# Patient Record
Sex: Female | Born: 1980 | Hispanic: Yes
Health system: Southern US, Community
[De-identification: ages and names within clinical notes are randomized; demographics above are authoritative.]

## PROBLEM LIST (undated history)

## (undated) DIAGNOSIS — E785 Hyperlipidemia, unspecified: Secondary | ICD-10-CM

## (undated) DIAGNOSIS — F319 Bipolar disorder, unspecified: Secondary | ICD-10-CM

## (undated) DIAGNOSIS — E119 Type 2 diabetes mellitus without complications: Secondary | ICD-10-CM

---

## 2020-02-15 ENCOUNTER — Encounter (HOSPITAL_COMMUNITY): Payer: Self-pay

## 2020-02-15 ENCOUNTER — Emergency Department (HOSPITAL_COMMUNITY): Payer: Self-pay

## 2020-02-15 ENCOUNTER — Emergency Department (HOSPITAL_COMMUNITY)
Admission: EM | Admit: 2020-02-15 | Discharge: 2020-02-16 | Disposition: A | Discharge status: Home / Self Care | Payer: Self-pay | Attending: Emergency Medicine | Admitting: Emergency Medicine

## 2020-02-15 ENCOUNTER — Other Ambulatory Visit: Payer: Self-pay

## 2020-02-15 DIAGNOSIS — U071 COVID-19: Secondary | ICD-10-CM

## 2020-02-15 DIAGNOSIS — E119 Type 2 diabetes mellitus without complications: Secondary | ICD-10-CM | POA: Insufficient documentation

## 2020-02-15 DIAGNOSIS — Z794 Long term (current) use of insulin: Secondary | ICD-10-CM | POA: Insufficient documentation

## 2020-02-15 DIAGNOSIS — J1282 Pneumonia due to coronavirus disease 2019: Secondary | ICD-10-CM

## 2020-02-15 DIAGNOSIS — R0602 Shortness of breath: Secondary | ICD-10-CM | POA: Insufficient documentation

## 2020-02-15 DIAGNOSIS — R0789 Other chest pain: Secondary | ICD-10-CM | POA: Insufficient documentation

## 2020-02-15 HISTORY — DX: Type 2 diabetes mellitus without complications: E11.9

## 2020-02-15 HISTORY — DX: Hyperlipidemia, unspecified: E78.5

## 2020-02-15 HISTORY — DX: Bipolar disorder, unspecified: F31.9

## 2020-02-15 LAB — CBC
HCT: 39.9 % (ref 36.0–46.0)
Hemoglobin: 13.5 g/dL (ref 12.0–15.0)
MCH: 30.6 pg (ref 26.0–34.0)
MCHC: 33.8 g/dL (ref 30.0–36.0)
MCV: 90.5 fL (ref 80.0–100.0)
Platelets: 404 10*3/uL — ABNORMAL HIGH (ref 150–400)
RBC: 4.41 MIL/uL (ref 3.87–5.11)
RDW: 12.8 % (ref 11.5–15.5)
WBC: 11.6 10*3/uL — ABNORMAL HIGH (ref 4.0–10.5)
nRBC: 0 % (ref 0.0–0.2)

## 2020-02-15 LAB — BASIC METABOLIC PANEL
Anion gap: 11 (ref 5–15)
BUN: 18 mg/dL (ref 6–20)
CO2: 23 mmol/L (ref 22–32)
Calcium: 9.9 mg/dL (ref 8.9–10.3)
Chloride: 96 mmol/L — ABNORMAL LOW (ref 98–111)
Creatinine, Ser: 0.75 mg/dL (ref 0.44–1.00)
GFR, Estimated: >60 mL/min (ref >60)
Glucose, Bld: 353 mg/dL — ABNORMAL HIGH (ref 70–99)
Potassium: 4.2 mmol/L (ref 3.5–5.1)
Sodium: 130 mmol/L — ABNORMAL LOW (ref 135–145)

## 2020-02-15 LAB — TROPONIN I (HIGH SENSITIVITY)
Troponin I (High Sensitivity): <2 ng/L (ref <18)
Troponin I (High Sensitivity): <2 ng/L (ref <18)

## 2020-02-15 LAB — I-STAT BETA HCG BLOOD, ED (NOT ORDERABLE): I-stat hCG, quantitative: <5 m[IU]/mL (ref <5)

## 2020-02-15 MED ORDER — SODIUM CHLORIDE 0.9 % IV SOLN: INTRAVENOUS | Status: DC

## 2020-02-15 MED ORDER — SODIUM CHLORIDE 0.9 % IV BOLUS
1000.0000 mL | Freq: Once | INTRAVENOUS | Status: AC
  Administered 2020-02-15: 1000 mL via INTRAVENOUS

## 2020-02-15 MED ORDER — IOHEXOL 350 MG/ML SOLN
100.0000 mL | Freq: Once | INTRAVENOUS | Status: AC | PRN
  Administered 2020-02-15: 100 mL via INTRAVENOUS

## 2020-02-15 NOTE — ED Provider Notes (Signed)
Nursing notes and vitals signs, including pulse oximetry, reviewed.  Summary of this visit's results, reviewed by myself:  EKG:  EKG Interpretation  Date/Time:    Ventricular Rate:    PR Interval:    QRS Duration:   QT Interval:    QTC Calculation:   R Axis:     Text Interpretation:         Labs:  Results for orders placed or performed during the hospital encounter of 02/15/20 (from the past 24 hour(s))  Basic metabolic panel     Status: Abnormal   Collection Time: 02/15/20 12:56 AM  Result Value Ref Range   Sodium 130 (L) 135 - 145 mmol/L   Potassium 4.2 3.5 - 5.1 mmol/L   Chloride 96 (L) 98 - 111 mmol/L   CO2 23 22 - 32 mmol/L   Glucose, Bld 353 (H) 70 - 99 mg/dL   BUN 18 6 - 20 mg/dL   Creatinine, Ser 1.22 0.44 - 1.00 mg/dL   Calcium 9.9 8.9 - 48.2 mg/dL   GFR, Estimated >50 >03 mL/min   Anion gap 11 5 - 15  CBC     Status: Abnormal   Collection Time: 02/15/20 12:56 AM  Result Value Ref Range   WBC 11.6 (H) 4.0 - 10.5 K/uL   RBC 4.41 3.87 - 5.11 MIL/uL   Hemoglobin 13.5 12.0 - 15.0 g/dL   HCT 70.4 88.8 - 91.6 %   MCV 90.5 80.0 - 100.0 fL   MCH 30.6 26.0 - 34.0 pg   MCHC 33.8 30.0 - 36.0 g/dL   RDW 94.5 03.8 - 88.2 %   Platelets 404 (H) 150 - 400 K/uL   nRBC 0.0 0.0 - 0.2 %  Troponin I (High Sensitivity)     Status: None   Collection Time: 02/15/20 12:56 AM  Result Value Ref Range   Troponin I (High Sensitivity) <2 <18 ng/L  I-Stat beta hCG blood, ED     Status: None   Collection Time: 02/15/20  1:08 AM  Result Value Ref Range   I-stat hCG, quantitative <5.0 <5 mIU/mL   Comment 3          Troponin I (High Sensitivity)     Status: None   Collection Time: 02/15/20  7:31 PM  Result Value Ref Range   Troponin I (High Sensitivity) <2 <18 ng/L    Imaging Studies: DG Chest 2 View  Result Date: 02/15/2020 CLINICAL DATA:  Shortness of breath COVID in December EXAM: CHEST - 2 VIEW COMPARISON:  None. FINDINGS: The heart size and mediastinal contours are within  normal limits. Prominence of the central pulmonary vasculature is seen. Patchy airspace opacity seen at the left lung base. The visualized skeletal structures are unremarkable. IMPRESSION: Patchy airspace opacity at the left lung base which may be due to atelectasis and/or infectious etiology Electronically Signed   By: Jonna Clark M.D.   On: 02/15/2020 01:24   CT Angio Chest PE W/Cm &/Or Wo Cm  Result Date: 02/15/2020 CLINICAL DATA:  COVID positive in December, left-sided chest pain EXAM: CT ANGIOGRAPHY CHEST WITH CONTRAST TECHNIQUE: Multidetector CT imaging of the chest was performed using the standard protocol during bolus administration of intravenous contrast. Multiplanar CT image reconstructions and MIPs were obtained to evaluate the vascular anatomy. CONTRAST:  OMNIPAQUE IOHEXOL 350 MG/ML SOLN COMPARISON:  None. FINDINGS: Cardiovascular: There is a optimal opacification of the pulmonary arteries. There is no central,segmental, or subsegmental filling defects within the pulmonary arteries. The heart is  normal in size. No pericardial effusion or thickening. No evidence right heart strain. There is normal three-vessel brachiocephalic anatomy without proximal stenosis. The thoracic aorta is normal in appearance. Mediastinum/Nodes: No hilar, mediastinal, or axillary adenopathy. Thyroid gland, trachea, and esophagus demonstrate no significant findings. Lungs/Pleura: Patchy airspace opacities are seen at both lung bases. There is also mild ground-glass opacity within the right middle lobe. No pleural effusion. No pneumothorax. Upper Abdomen: No acute abnormalities present in the visualized portions of the upper abdomen. Musculoskeletal: No chest wall abnormality. No acute or significant osseous findings. 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 Review of the MIP images confirms the above findings. IMPRESSION: No central, segmental, or subsegmental pulmonary  embolism. Patchy airspace opacity seen throughout both lower lungs, likely consistent with multifocal pneumonia. Electronically Signed   By: Jonna Clark M.D.   On: 02/15/2020 23:19   Lung findings consistent with recent diagnosis of COVID.  No evidence of PE.     Paula Libra, MD 02/15/20 2342

## 2020-02-15 NOTE — ED Provider Notes (Signed)
[facility]-EMERGENCY DEPT Provider Note   CSN: [id number] Arrival date & time: 02/15/20  0026     History Chief Complaint  Patient presents with  . Chest Pain    Carol Vance is a 40 y.o. female.  Patient diagnosed with COVID infection down in the [place] area on New Year's Eve December 31. Patient developed COVID-pneumonia after the first week in January. Patient was started on antibiotics by her primary care doctor. Patient is felt better for about a week. Patient still using albuterol inhaler. But starting 2 days ago she started to get some left-sided chest pain anteriorly and associated with shortness of breath. Here patient's oxygen saturations at rest are 95%. Respiratory rates not up she not tachycardic not febrile. Patient denies any leg swelling. But obviously the concern would be for possible pulmonary embolus secondary to the COVID infection. Patient is a known diabetic. Patient's been on the waiting room for a long period of time.        Past Medical History:  Diagnosis Date  . Bipolar 1 disorder (HCC)   . Diabetes mellitus without complication (HCC)   . Hyperlipidemia     There are no problems to display for this patient.      OB History   No obstetric history on file.     No family history on file.     Home Medications Prior to Admission medications   Medication Sig Start Date End Date Taking? Authorizing Provider  albuterol (VENTOLIN HFA) 108 (90 Base) MCG/ACT inhaler Inhale 2 puffs into the lungs 2 (two) times daily. 02/09/20  Yes [provider]  divalproex (DEPAKOTE) 250 MG DR tablet Take 750 mg by mouth at bedtime. 01/17/20  Yes [provider]  ezetimibe (ZETIA) 10 MG tablet Take 10 mg by mouth daily. 11/24/19  Yes [provider]  insulin glargine (LANTUS) 100 UNIT/ML Solostar Pen Inject 40 Units into the skin at bedtime. 11/23/19  Yes [provider]  insulin lispro (HUMALOG) 100  UNIT/ML KwikPen Inject 0-12 Units into the skin 3 (three) times daily as needed (for blood sugar more than 120). Sliding scale 03/24/19 03/23/20 Yes [provider]  Lurasidone HCl (LATUDA) 60 MG TABS Take 60 mg by mouth at bedtime. 01/17/20  Yes [provider]  Multiple Vitamin (MULTIVITAMIN WITH MINERALS) TABS tablet Take 1 tablet by mouth daily.   Yes [provider]  rosuvastatin (CRESTOR) 40 MG tablet Take 40 mg by mouth daily. 11/23/19  Yes [provider]  azithromycin (ZITHROMAX) 250 MG tablet Take 250 mg by mouth See admin instructions. Zpack for 5 days . Start date : 02/09/19 02/09/20   [provider]    Allergies    Patient has no known allergies.  Review of Systems   Review of Systems  Constitutional: Negative for chills and fever.  HENT: Negative for congestion, rhinorrhea and sore throat.   Eyes: Negative for visual disturbance.  Respiratory: Positive for shortness of breath. Negative for cough.   Cardiovascular: Positive for chest pain. Negative for leg swelling.  Gastrointestinal: Negative for abdominal pain, diarrhea, nausea and vomiting.  Genitourinary: Negative for dysuria.  Musculoskeletal: Negative for back pain and neck pain.  Skin: Negative for rash.  Neurological: Negative for dizziness, light-headedness and headaches.  Hematological: Does not bruise/bleed easily.  Psychiatric/Behavioral: Negative for confusion.    Physical Exam Updated Vital Signs BP 116/67   Pulse 84   Temp 98.6 F (37 C) (Oral)   Resp Marland Kitchen)  22   Ht 1.499 m (4\' 11" )   Wt 66.2 kg   SpO2 96%   BMI 29.49 kg/m   Physical Exam Vitals and nursing note reviewed.  Constitutional:      General: She is not in acute distress.    Appearance: Normal appearance. She is well-developed and well-nourished. She is not toxic-appearing.  HENT:     Head: Normocephalic and atraumatic.  Eyes:     Conjunctiva/sclera: Conjunctivae normal.  Cardiovascular:      Rate and Rhythm: Normal rate and regular rhythm.     Heart sounds: No murmur heard.   Pulmonary:     Effort: Pulmonary effort is normal. No respiratory distress.     Breath sounds: Normal breath sounds.  Chest:     Chest wall: No tenderness.  Abdominal:     Palpations: Abdomen is soft.     Tenderness: There is no abdominal tenderness.  Musculoskeletal:        General: No edema. Normal range of motion.     Cervical back: Neck supple.  Skin:    General: Skin is warm and dry.     Capillary Refill: Capillary refill takes less than 2 seconds.  Neurological:     General: No focal deficit present.     Mental Status: She is alert and oriented to person, place, and time.     Cranial Nerves: No cranial nerve deficit.     Sensory: No sensory deficit.     Motor: No weakness.  Psychiatric:        Mood and Affect: Mood and affect normal.     ED Results / Procedures / Treatments   Labs (all labs ordered are listed, but only abnormal results are displayed) Labs Reviewed  BASIC METABOLIC PANEL - Abnormal; Notable for the following components:      Result Value   Sodium 130 (*)    Chloride 96 (*)    Glucose, Bld 353 (*)    All other components within normal limits  CBC - Abnormal; Notable for the following components:   WBC 11.6 (*)    Platelets 404 (*)    All other components within normal limits  I-STAT BETA HCG BLOOD, ED ([facility], [facility], [facility] ONLY)  I-STAT BETA HCG BLOOD, ED (NOT ORDERABLE)  TROPONIN I (HIGH SENSITIVITY)  TROPONIN I (HIGH SENSITIVITY)    EKG None  Radiology DG Chest 2 View  Result Date: 02/15/2020 CLINICAL DATA:  Shortness of breath COVID in December EXAM: CHEST - 2 VIEW COMPARISON:  None. FINDINGS: The heart size and mediastinal contours are within normal limits. Prominence of the central pulmonary vasculature is seen. Patchy airspace opacity seen at the left lung base. The visualized skeletal structures are unremarkable. IMPRESSION: Patchy airspace opacity at the left  lung base which may be due to atelectasis and/or infectious etiology Electronically Signed   By: January M.D.   On: 02/15/2020 01:24   CT Angio Chest PE W/Cm &/Or Wo Cm  Result Date: 02/15/2020 CLINICAL DATA:  COVID positive in December, left-sided chest pain EXAM: CT ANGIOGRAPHY CHEST WITH CONTRAST TECHNIQUE: Multidetector CT imaging of the chest was performed using the standard protocol during bolus administration of intravenous contrast. Multiplanar CT image reconstructions and MIPs were obtained to evaluate the vascular anatomy. CONTRAST:  January OMNIPAQUE IOHEXOL 350 MG/ML SOLN COMPARISON:  None. FINDINGS: Cardiovascular: There is a optimal opacification of the pulmonary arteries. There is no central,segmental, or subsegmental filling defects within the pulmonary arteries. The heart is normal  in size. No pericardial effusion or thickening. No evidence right heart strain. There is normal three-vessel brachiocephalic anatomy without proximal stenosis. The thoracic aorta is normal in appearance. Mediastinum/Nodes: No hilar, mediastinal, or axillary adenopathy. Thyroid gland, trachea, and esophagus demonstrate no significant findings. Lungs/Pleura: Patchy airspace opacities are seen at both lung bases. There is also mild ground-glass opacity within the right middle lobe. No pleural effusion. No pneumothorax. Upper Abdomen: No acute abnormalities present in the visualized portions of the upper abdomen. Musculoskeletal: No chest wall abnormality. No acute or significant osseous findings. 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 Review of the MIP images confirms the above findings. IMPRESSION: No central, segmental, or subsegmental pulmonary embolism. Patchy airspace opacity seen throughout both lower lungs, likely consistent with multifocal pneumonia. Electronically Signed   By: Jonna Clark M.D.   On: 02/15/2020 23:19    Procedures Procedures (including  critical care time)  Medications Ordered in ED Medications  0.9 %  sodium chloride infusion (0 mLs Intravenous Hold 02/15/20 2226)  sodium chloride 0.9 % bolus 1,000 mL (1,000 mLs Intravenous New Bag/Given 02/15/20 2225)  iohexol (OMNIPAQUE) 350 MG/ML injection 100 mL (100 mLs Intravenous Contrast Given 02/15/20 2306)    ED Course  I have reviewed the triage vital signs and the nursing notes.  Pertinent labs & imaging results that were available during my care of the patient were reviewed by me and considered in my medical decision making (see chart for details).    MDM Rules/Calculators/A&P                          Patient nontoxic no acute distress. The clinical concern is whether she has a pulmonary embolus. If she does she will require admission if not patient can be discharged home. Troponins x2 without any acute findings. Chest x-ray shows evidence of what is probably some residual pneumonia. Patient does not seem to have any acute pneumonia at this time. This regional pneumonia is probably secondary to COVID. But has not completely cleared yet. No concern for acute cardiac event  Patient turned over to overnight physician Dr. Read Drivers who will follow-up on the CT angio results.  Final Clinical Impression(s) / ED Diagnoses Final diagnoses:  Atypical chest pain    Rx / DC Orders ED Discharge Orders    None       Vanetta Mulders, MD 02/15/20 2324

## 2020-02-15 NOTE — ED Triage Notes (Signed)
Patient arrived stating she had pneumonia from covid-19 in December and today began having left sided chest pain and shortness of breath.

## 2022-07-02 IMAGING — CT CT ANGIO CHEST
2 of 6 series · 18 of 36 positions shown · IV contrast (omnipaque)
Comparison: None.

CLINICAL DATA: COVID positive in [REDACTED], left-sided chest pain

EXAM:
CT ANGIOGRAPHY CHEST WITH CONTRAST
TECHNIQUE: Multidetector CT imaging of the chest was performed using the
standard protocol during bolus administration of intravenous
contrast. Multiplanar CT image reconstructions and MIPs were
obtained to evaluate the vascular anatomy.
CONTRAST:  100mL OMNIPAQUE IOHEXOL 350 MG/ML SOLN

[Series 6: thins · axial · 0.62mm/px · z∈[+1588,+1812]mm · 17 of 253 slices shown]
[im 15/253  lung]
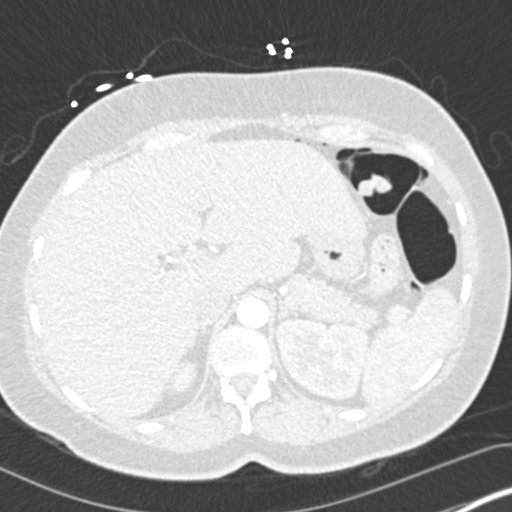
[im 29/253  mediastinal]
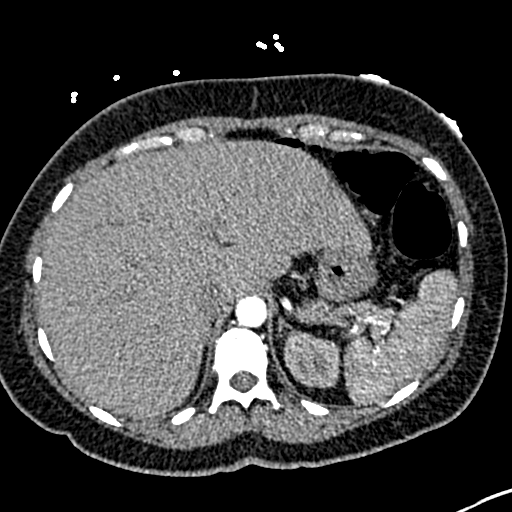
[im 43/253  lung]
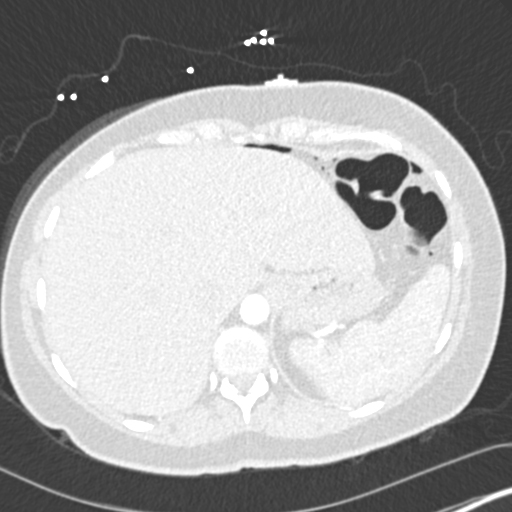
[im 57/253  mediastinal]
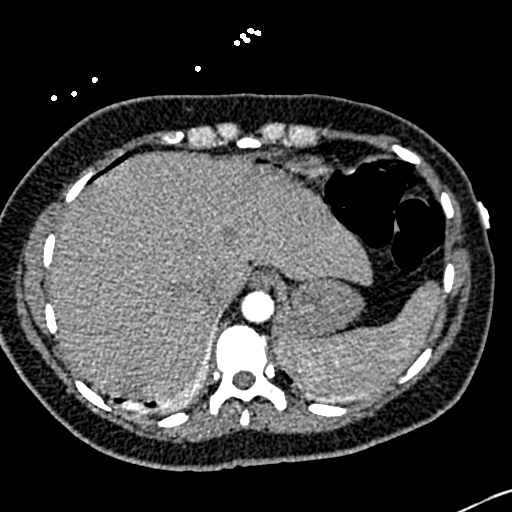
[im 71/253  lung]
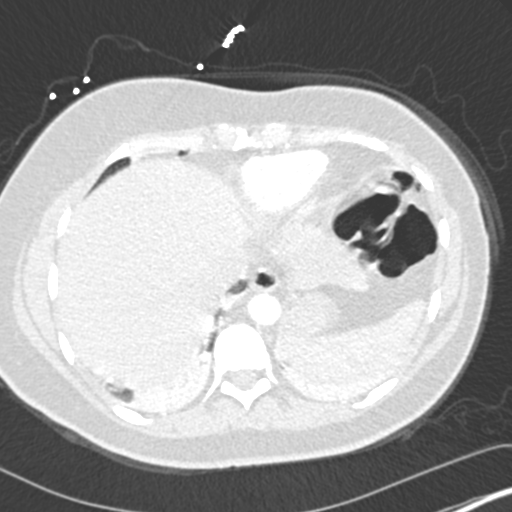
[im 85/253  mediastinal]
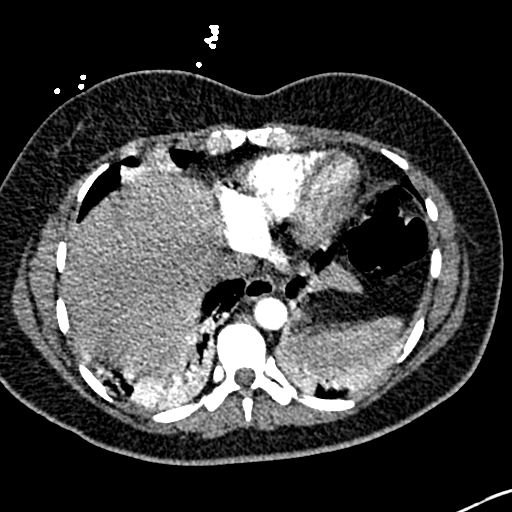
[im 99/253  lung]
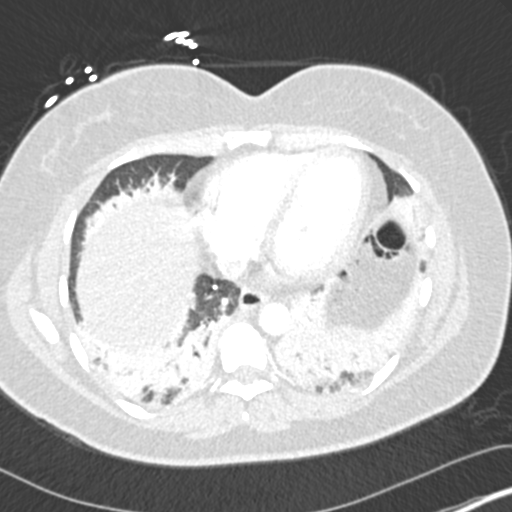
[im 113/253  mediastinal]
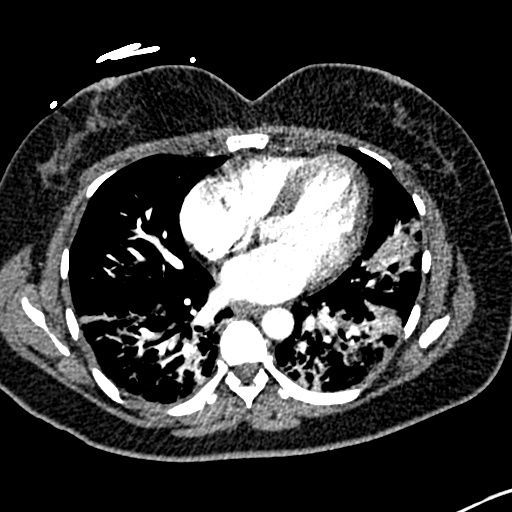
[im 127/253  lung]
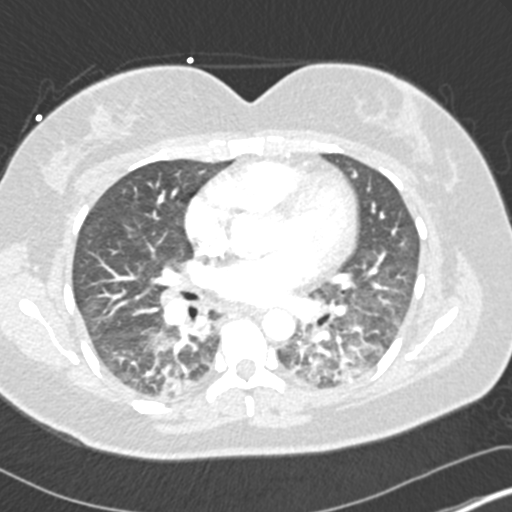
[im 141/253  mediastinal]
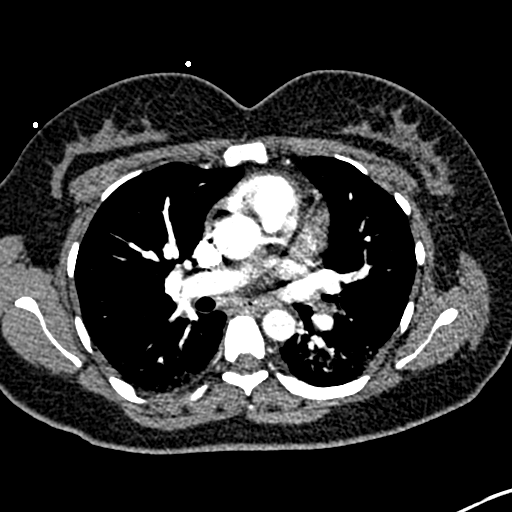
[im 155/253  lung]
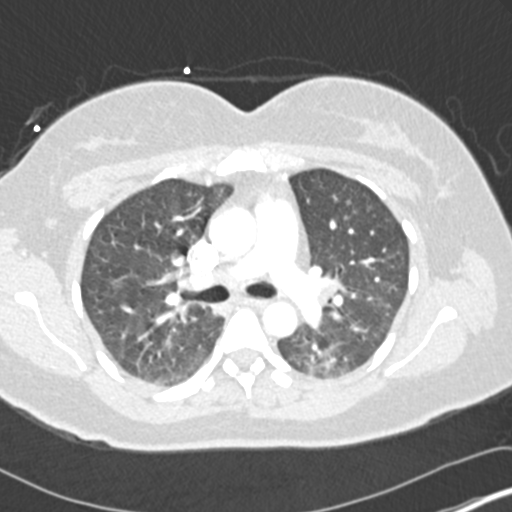
[im 169/253  mediastinal]
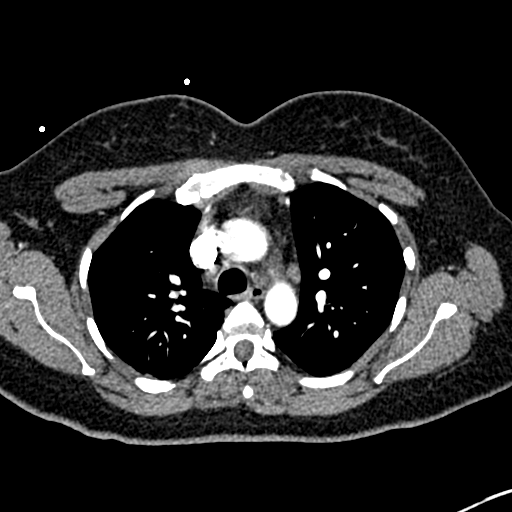
[im 183/253  lung]
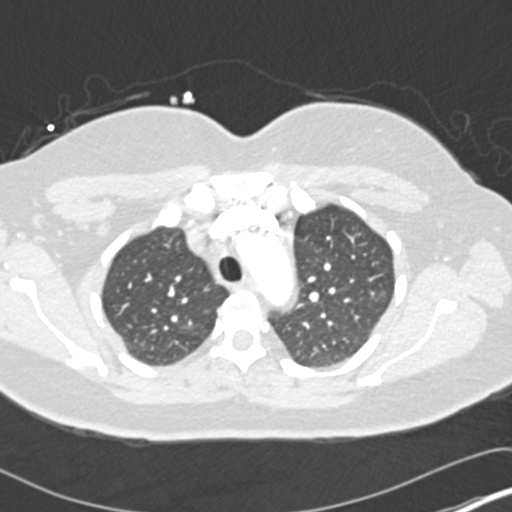
[im 197/253  mediastinal]
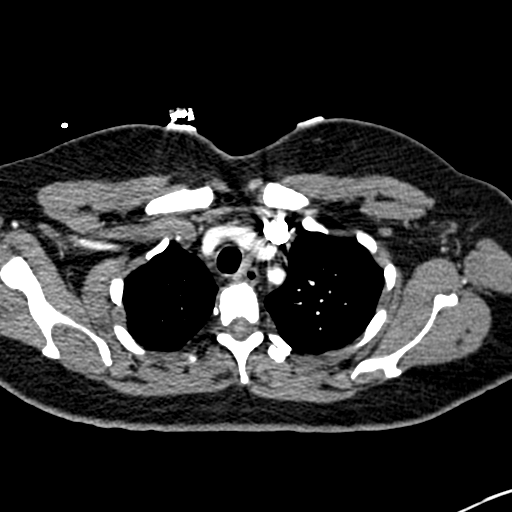
[im 211/253  lung]
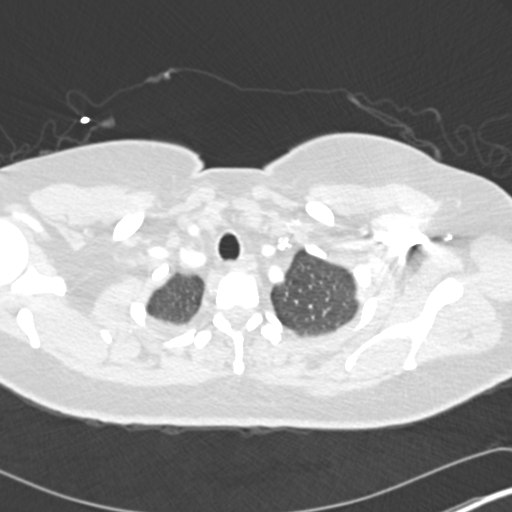
[im 225/253  mediastinal]
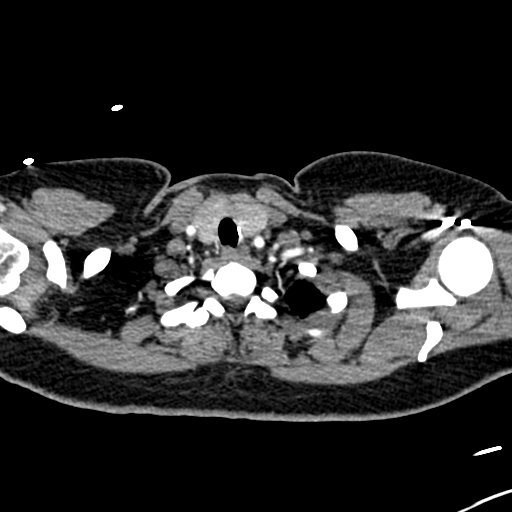
[im 239/253  lung]
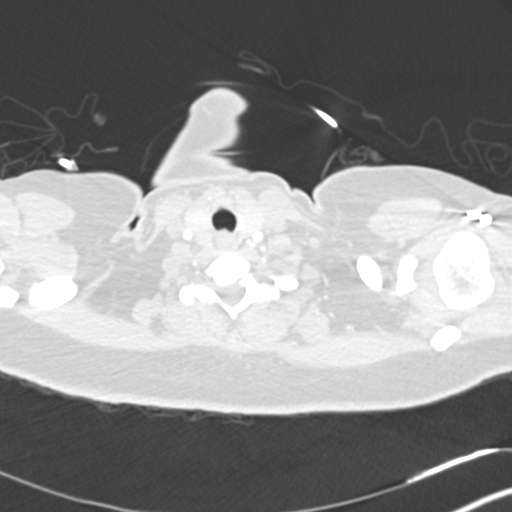

[Series 8: coronal mpr · coronal · 0.50mm/px · 1 of 117 slices shown]
[im 59/117  mediastinal]
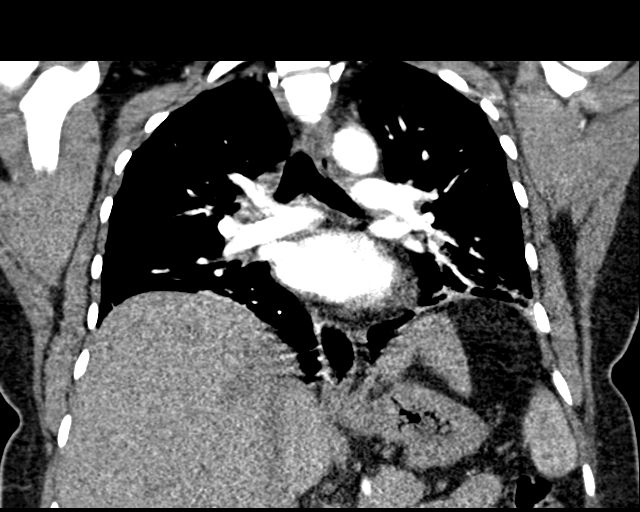

[18 of 36 positions shown; findings below may reference images not displayed]

FINDINGS: Cardiovascular: There is a optimal opacification of the pulmonary
arteries. There is no central,segmental, or subsegmental filling
defects within the pulmonary arteries. The heart is normal in size.
No pericardial effusion or thickening. No evidence right heart
strain. There is normal three-vessel brachiocephalic anatomy without
proximal stenosis. The thoracic aorta is normal in appearance.

Mediastinum/Nodes: No hilar, mediastinal, or axillary adenopathy.
Thyroid gland, trachea, and esophagus demonstrate no significant
findings.

Lungs/Pleura: Patchy airspace opacities are seen at both lung bases.
There is also mild ground-glass opacity within the right middle
lobe. No pleural effusion. No pneumothorax.

Upper Abdomen: No acute abnormalities present in the visualized
portions of the upper abdomen.

Musculoskeletal: No chest wall abnormality. No acute or significant
osseous findings. Sclerotic lesion seen within the T9 vertebral
body, likely intra osseous hemangioma.

Review of the MIP images confirms the above findings.
IMPRESSION: No central, segmental, or subsegmental pulmonary embolism.

Patchy airspace opacity seen throughout both lower lungs, likely
consistent with multifocal pneumonia.
